# Patient Record
Sex: Female | Born: 1955 | Hispanic: Yes | Marital: Single | Smoking: Never smoker
Health system: Southern US, Community
[De-identification: ages and names within clinical notes are randomized; demographics above are authoritative.]

## PROBLEM LIST (undated history)

## (undated) DIAGNOSIS — I10 Essential (primary) hypertension: Secondary | ICD-10-CM

## (undated) DIAGNOSIS — R9439 Abnormal result of other cardiovascular function study: Secondary | ICD-10-CM

## (undated) DIAGNOSIS — E7800 Pure hypercholesterolemia, unspecified: Secondary | ICD-10-CM

## (undated) DIAGNOSIS — I671 Cerebral aneurysm, nonruptured: Secondary | ICD-10-CM

## (undated) HISTORY — PX: BRAIN SURGERY: SHX531

---

## 2015-12-15 ENCOUNTER — Emergency Department (HOSPITAL_BASED_OUTPATIENT_CLINIC_OR_DEPARTMENT_OTHER): Payer: BLUE CROSS/BLUE SHIELD

## 2015-12-15 ENCOUNTER — Encounter (HOSPITAL_BASED_OUTPATIENT_CLINIC_OR_DEPARTMENT_OTHER): Payer: Self-pay | Admitting: *Deleted

## 2015-12-15 ENCOUNTER — Observation Stay (HOSPITAL_BASED_OUTPATIENT_CLINIC_OR_DEPARTMENT_OTHER)
Admission: EM | Admit: 2015-12-15 | Discharge: 2015-12-18 | Disposition: A | Discharge status: Home / Self Care | Payer: BLUE CROSS/BLUE SHIELD | Attending: Internal Medicine | Admitting: Internal Medicine

## 2015-12-15 DIAGNOSIS — R079 Chest pain, unspecified: Secondary | ICD-10-CM | POA: Diagnosis present

## 2015-12-15 DIAGNOSIS — Z79899 Other long term (current) drug therapy: Secondary | ICD-10-CM | POA: Diagnosis not present

## 2015-12-15 DIAGNOSIS — R9439 Abnormal result of other cardiovascular function study: Secondary | ICD-10-CM | POA: Diagnosis not present

## 2015-12-15 DIAGNOSIS — I10 Essential (primary) hypertension: Secondary | ICD-10-CM | POA: Diagnosis not present

## 2015-12-15 DIAGNOSIS — I200 Unstable angina: Secondary | ICD-10-CM | POA: Insufficient documentation

## 2015-12-15 DIAGNOSIS — E785 Hyperlipidemia, unspecified: Secondary | ICD-10-CM | POA: Diagnosis not present

## 2015-12-15 DIAGNOSIS — Z7982 Long term (current) use of aspirin: Secondary | ICD-10-CM | POA: Diagnosis not present

## 2015-12-15 DIAGNOSIS — R519 Headache, unspecified: Secondary | ICD-10-CM

## 2015-12-15 DIAGNOSIS — Z8679 Personal history of other diseases of the circulatory system: Secondary | ICD-10-CM | POA: Diagnosis not present

## 2015-12-15 DIAGNOSIS — H547 Unspecified visual loss: Secondary | ICD-10-CM | POA: Diagnosis not present

## 2015-12-15 DIAGNOSIS — E7800 Pure hypercholesterolemia, unspecified: Secondary | ICD-10-CM | POA: Insufficient documentation

## 2015-12-15 DIAGNOSIS — R0789 Other chest pain: Principal | ICD-10-CM | POA: Insufficient documentation

## 2015-12-15 DIAGNOSIS — R51 Headache: Secondary | ICD-10-CM

## 2015-12-15 DIAGNOSIS — R55 Syncope and collapse: Secondary | ICD-10-CM

## 2015-12-15 HISTORY — DX: Abnormal result of other cardiovascular function study: R94.39

## 2015-12-15 HISTORY — DX: Cerebral aneurysm, nonruptured: I67.1

## 2015-12-15 HISTORY — DX: Essential (primary) hypertension: I10

## 2015-12-15 HISTORY — DX: Pure hypercholesterolemia, unspecified: E78.00

## 2015-12-15 LAB — D-DIMER, QUANTITATIVE: D-Dimer, Quant: 0.38 ug/mL-FEU (ref 0.00–0.50)

## 2015-12-15 LAB — CBC
HCT: 35.5 % — ABNORMAL LOW (ref 36.0–46.0)
Hemoglobin: 12 g/dL (ref 12.0–15.0)
MCH: 32.1 pg (ref 26.0–34.0)
MCHC: 33.8 g/dL (ref 30.0–36.0)
MCV: 94.9 fL (ref 78.0–100.0)
Platelets: 241 10*3/uL (ref 150–400)
RBC: 3.74 MIL/uL — ABNORMAL LOW (ref 3.87–5.11)
RDW: 12.4 % (ref 11.5–15.5)
WBC: 6.5 10*3/uL (ref 4.0–10.5)

## 2015-12-15 LAB — BASIC METABOLIC PANEL
Anion gap: 7 (ref 5–15)
BUN: 22 mg/dL — ABNORMAL HIGH (ref 6–20)
CO2: 27 mmol/L (ref 22–32)
Calcium: 9.2 mg/dL (ref 8.9–10.3)
Chloride: 105 mmol/L (ref 101–111)
Creatinine, Ser: 0.62 mg/dL (ref 0.44–1.00)
GFR calc Af Amer: >60 mL/min (ref >60)
GFR calc non Af Amer: >60 mL/min (ref >60)
Glucose, Bld: 99 mg/dL (ref 65–99)
Potassium: 3.5 mmol/L (ref 3.5–5.1)
Sodium: 139 mmol/L (ref 135–145)

## 2015-12-15 LAB — TROPONIN I
Troponin I: <0.03 ng/mL (ref <0.03)
Troponin I: <0.03 ng/mL (ref <0.03)

## 2015-12-15 MED ORDER — NITROGLYCERIN 0.4 MG SL SUBL
0.4000 mg | SUBLINGUAL_TABLET | SUBLINGUAL | Status: DC | PRN
  Administered 2015-12-15 (×2): 0.4 mg via SUBLINGUAL
  Filled 2015-12-15: qty 1

## 2015-12-15 MED ORDER — MORPHINE SULFATE (PF) 2 MG/ML IV SOLN
2.0000 mg | Freq: Once | INTRAVENOUS | Status: AC
  Administered 2015-12-16: 2 mg via INTRAVENOUS
  Filled 2015-12-15: qty 1

## 2015-12-15 MED ORDER — SODIUM CHLORIDE 0.9 % IV BOLUS (SEPSIS): 250.0000 mL | Freq: Once | INTRAVENOUS | Status: DC

## 2015-12-15 MED ORDER — ONDANSETRON HCL 4 MG/2ML IJ SOLN
4.0000 mg | Freq: Once | INTRAMUSCULAR | Status: AC
Start: 2015-12-15 — End: 2015-12-15
  Administered 2015-12-15: 4 mg via INTRAVENOUS
  Filled 2015-12-15: qty 2

## 2015-12-15 MED ORDER — SODIUM CHLORIDE 0.9 % IV SOLN: INTRAVENOUS | Status: DC

## 2015-12-15 MED ORDER — ASPIRIN 81 MG PO CHEW
162.0000 mg | CHEWABLE_TABLET | Freq: Once | ORAL | Status: AC
  Administered 2015-12-15: 162 mg via ORAL
  Filled 2015-12-15: qty 2

## 2015-12-15 NOTE — ED Notes (Signed)
Chest pain since yesterday. Sharp in nature. Sob today. Pain goes to her head per son in law.

## 2015-12-15 NOTE — ED Notes (Signed)
Patient crying in pain to her chest. Patient to x-ray at this time

## 2015-12-15 NOTE — ED Notes (Signed)
Patient states that she is having pain and it increases with a deep breath

## 2015-12-15 NOTE — ED Provider Notes (Signed)
CSN: [id number]     Arrival date & time 12/15/15  1836 History  By signing my name below, I, [facility] Washington, attest that this documentation has been prepared under the direction and in the presence of Vanetta MuldersScott Brody Kump, MD. Electronically Signed: Randell PatientMarrissa Washington, ED Scribe. 12/15/2015. 10:21 PM.   Chief Complaint  Patient presents with  . Chest Pain     Patient is a 60 y.o. female presenting with chest pain. The history is provided by the patient. No language interpreter was used.  Chest Pain Pain quality: pressure and sharp   Pain radiates to:  Does not radiate Pain radiates to the back: yes   Pain severity:  Severe Onset quality:  Gradual Duration:  1 day Timing:  Constant Progression:  Worsening Chronicity:  New Relieved by:  Nothing Worsened by:  Nothing tried Ineffective treatments:  Aspirin Associated symptoms: back pain, headache and shortness of breath   Associated symptoms: no abdominal pain, no cough, no fever, no nausea and not vomiting   Risk factors: hypertension   Risk factors: not female and no prior DVT/PE    HPI Comments: Monica Carr is a 60 y.o. female with a hx of HTN and high cholesterol who presents to the Emergency Department complaining of constant, gradually worsening, 10/10 central CP that radiates to her back onset yesterday, dramatically worse 8 hours ago. History is provided by a relative who acted as an interpreter for the pt. She notes that the pain began as sharp pain that progressed to pressure today. She reports associated HA, intermittent blurred vision, bilateral ankle swelling. Pain is worse with breathing. She has takes 2x baby aspirin daily and took this medication today without relief. He notes that [place] where she had an episode of complete vision loss. Denies similar symptoms in the past. Denies hx of MI. Denies fevers, nausea, vomiting, or any other symptoms currently.  Past Medical  History  Diagnosis Date  . Hypertension   . High cholesterol   . Cerebral aneurysm    Past Surgical History  Procedure Laterality Date  . Brain surgery     No family history on file. Social History  Substance Use Topics  . Smoking status: Never Smoker   . Smokeless tobacco: None  . Alcohol Use: No   OB History    No data available     Review of Systems  Constitutional: Negative for fever.  HENT: Negative for rhinorrhea and sore throat.   Eyes: Positive for visual disturbance.  Respiratory: Positive for shortness of breath. Negative for cough.   Cardiovascular: Positive for chest pain and leg swelling.  Gastrointestinal: Negative for nausea, vomiting and abdominal pain.  Genitourinary: Positive for frequency.  Musculoskeletal: Positive for back pain.  Skin: Negative for rash.  Neurological: Positive for headaches.  Hematological: Does not bruise/bleed easily.  Psychiatric/Behavioral: Negative for confusion.      Allergies  Review of patient's allergies indicates no known allergies.  Home Medications   Prior to Admission medications   Medication Sig Start Date End Date Taking? Authorizing Provider  Aspirin (ASPIR-81 PO) Take by mouth.   Yes Historical Provider, MD  lisinopril-hydrochlorothiazide (PRINZIDE,ZESTORETIC) 20-25 MG tablet Take 1 tablet by mouth daily.   Yes Historical Provider, MD  pravastatin (PRAVACHOL) 40 MG tablet Take 40 mg by mouth daily.   Yes Historical Provider, MD   BP 154/77 mmHg  Pulse 68  Temp(Src) 98.6 F (37 C) (Oral)  Resp 14  Ht 5\' 1"  (1.549 m)  Wt 72.576 kg  BMI 30.25 kg/m2  SpO2 96% Physical Exam  Constitutional: She is oriented to person, place, and time. She appears well-developed and well-nourished. No distress.  HENT:  Head: Normocephalic and atraumatic.  Mouth/Throat: Mucous membranes are normal.  Mpist mucus membranes  Eyes: Conjunctivae and EOM are normal. Pupils are equal, round, and reactive to light.  Injected  scleras. Pupils and EOM normal.  Neck: Normal range of motion.  Cardiovascular: Normal rate and regular rhythm.   Regular rate and rhythm.  Pulmonary/Chest: Effort normal and breath sounds normal. No respiratory distress. She exhibits tenderness.  Abdominal: Bowel sounds are normal. There is tenderness.  Epigastric tenderness.  Musculoskeletal: Normal range of motion. She exhibits no edema.  No swelling in bilateral ankles.  Neurological: She is alert and oriented to person, place, and time.  Skin: Skin is warm and dry.  Psychiatric: She has a normal mood and affect. Her behavior is normal.  Nursing note and vitals reviewed.   ED Course  Procedures   DIAGNOSTIC STUDIES: Oxygen Saturation is 100% on RA, normal by my interpretation.    COORDINATION OF CARE: 10:08 PM Ordered chest x-ray and labs. Discussed treatment plan with pt at bedside and pt agreed to plan.   Labs Review Labs Reviewed  BASIC METABOLIC PANEL - Abnormal; Notable for the following:    BUN 22 (*)    All other components within normal limits  CBC - Abnormal; Notable for the following:    RBC 3.74 (*)    HCT 35.5 (*)    All other components within normal limits  TROPONIN I  D-DIMER, QUANTITATIVE (NOT AT [facility])  TROPONIN I   Results for orders placed or performed during the hospital encounter of 12/15/15  Basic metabolic panel  Result Value Ref Range   Sodium 139 135 - 145 mmol/L   Potassium 3.5 3.5 - 5.1 mmol/L   Chloride 105 101 - 111 mmol/L   CO2 27 22 - 32 mmol/L   Glucose, Bld 99 65 - 99 mg/dL   BUN 22 (H) 6 - 20 mg/dL   Creatinine, Ser 8.11 0.44 - 1.00 mg/dL   Calcium 9.2 8.9 - 91.4 mg/dL   GFR calc non Af Amer >60 >60 mL/min   GFR calc Af Amer >60 >60 mL/min   Anion gap 7 5 - 15  CBC  Result Value Ref Range   WBC 6.5 4.0 - 10.5 K/uL   RBC 3.74 (L) 3.87 - 5.11 MIL/uL   Hemoglobin 12.0 12.0 - 15.0 g/dL   HCT 78.2 (L) 95.6 - 21.3 %   MCV 94.9 78.0 - 100.0 fL   MCH 32.1 26.0 - 34.0 pg   MCHC  33.8 30.0 - 36.0 g/dL   RDW 08.6 57.8 - 46.9 %   Platelets 241 150 - 400 K/uL  Troponin I  Result Value Ref Range   Troponin I <0.03 <0.03 ng/mL  D-dimer, quantitative (not at [facility])  Result Value Ref Range   D-Dimer, Quant 0.38 0.00 - 0.50 ug/mL-FEU  Troponin I  Result Value Ref Range   Troponin I <0.03 <0.03 ng/mL     Imaging Review Dg Chest 2 View  12/15/2015  CLINICAL DATA:  Acute chest pain and shortness of breath for 1 day. EXAM: CHEST  2 VIEW COMPARISON:  None. FINDINGS: The cardiopericardial silhouette is unremarkable. A mildly tortuous/ectatic thoracic aorta noted. There is no evidence of focal airspace disease, pulmonary edema, suspicious pulmonary nodule/mass, pleural  effusion, or pneumothorax. No acute bony abnormalities are identified. IMPRESSION: No evidence of acute cardiopulmonary disease. Electronically Signed   By: Harmon Pier M.D.   On: 12/15/2015 20:02   I have personally reviewed and evaluated these images and lab results as part of my medical decision-making.   EKG Interpretation   Date/Time:  Friday December 15 2015 18:50:30 EDT Ventricular Rate:  71 PR Interval:    QRS Duration: 90 QT Interval:  390 QTC Calculation: 423 R Axis:   60 Text Interpretation:  Accelerated Junctional rhythm Possible Anterior  infarct , age undetermined ST \\T \ T wave abnormality, consider lateral  ischemia Abnormal ECG No previous ECGs available Confirmed by Cray Monnin   MD, Glema Takaki [phone]) on 12/15/2015 8:19:09 PM      MDM   Final diagnoses:  Chest pain, unspecified chest pain type   Patient with new onset of chest pain yesterday no prior history of chest pain.  Patient's had prior history of stroke or head bleed. But no cardiac history. Patient has cardiac risk factors of hypertension and high cholesterol.  Troponins 2 are negative however chest pain got worse today at about 2 in the afternoon and has persisted. This warrants admission for chest pain rule out.  D-dimer  negative no evidence of pulmonary embolism based on that. Patient is not tachycardic does complain of shortness of breath chest x-rays negative for pneumonia pneumothorax or pulmonary edema. EKG on shows a junctional rhythm with a possible anterior infarct age undetermined. But nothing acute.  Patient's chest pain improved significantly with with completing her aspirin course. And sublingual nitroglycerin. Pain went from 10 out of 10 down to 5 out of 10. A sheet given morphine with further resolution of the chest pain. Patient much more comfortable. Pain not completely gone.    I personally performed the services described in this documentation, which was scribed in my presence. The recorded information has been reviewed and is accurate.     Vanetta Mulders, MD 12/15/15 [phone]

## 2015-12-15 NOTE — ED Notes (Signed)
Patient states that her pain has decreased from 10 /10 to a 7/10 - patient reports that she now has a HA

## 2015-12-16 ENCOUNTER — Observation Stay (HOSPITAL_BASED_OUTPATIENT_CLINIC_OR_DEPARTMENT_OTHER): Payer: BLUE CROSS/BLUE SHIELD

## 2015-12-16 ENCOUNTER — Observation Stay (HOSPITAL_COMMUNITY): Payer: BLUE CROSS/BLUE SHIELD

## 2015-12-16 DIAGNOSIS — R079 Chest pain, unspecified: Secondary | ICD-10-CM | POA: Diagnosis present

## 2015-12-16 DIAGNOSIS — I209 Angina pectoris, unspecified: Secondary | ICD-10-CM | POA: Diagnosis not present

## 2015-12-16 DIAGNOSIS — E785 Hyperlipidemia, unspecified: Secondary | ICD-10-CM | POA: Diagnosis not present

## 2015-12-16 DIAGNOSIS — R9439 Abnormal result of other cardiovascular function study: Secondary | ICD-10-CM | POA: Diagnosis not present

## 2015-12-16 LAB — NM MYOCAR MULTI W/SPECT W/WALL MOTION / EF
CHL CUP RESTING HR STRESS: 56 {beats}/min
CSEPEDS: 28 s
CSEPEW: 1 METS
Exercise duration (min): 5 min
Peak HR: 62 {beats}/min

## 2015-12-16 LAB — BASIC METABOLIC PANEL
Anion gap: 7 (ref 5–15)
BUN: 15 mg/dL (ref 6–20)
CALCIUM: 9.2 mg/dL (ref 8.9–10.3)
CO2: 29 mmol/L (ref 22–32)
CREATININE: 0.62 mg/dL (ref 0.44–1.00)
Chloride: 105 mmol/L (ref 101–111)
Glucose, Bld: 118 mg/dL — ABNORMAL HIGH (ref 65–99)
Potassium: 3.6 mmol/L (ref 3.5–5.1)
Sodium: 141 mmol/L (ref 135–145)

## 2015-12-16 LAB — LIPID PANEL
CHOL/HDL RATIO: 4.9 ratio
Cholesterol: 194 mg/dL (ref 0–200)
HDL: 40 mg/dL — ABNORMAL LOW (ref >40)
LDL Cholesterol: 116 mg/dL — ABNORMAL HIGH (ref 0–99)
Triglycerides: 192 mg/dL — ABNORMAL HIGH (ref <150)
VLDL: 38 mg/dL (ref 0–40)

## 2015-12-16 LAB — CBC
HCT: 36 % (ref 36.0–46.0)
Hemoglobin: 12 g/dL (ref 12.0–15.0)
MCH: 31.6 pg (ref 26.0–34.0)
MCHC: 33.3 g/dL (ref 30.0–36.0)
MCV: 94.7 fL (ref 78.0–100.0)
PLATELETS: 233 10*3/uL (ref 150–400)
RBC: 3.8 MIL/uL — ABNORMAL LOW (ref 3.87–5.11)
RDW: 12.5 % (ref 11.5–15.5)
WBC: 6.1 10*3/uL (ref 4.0–10.5)

## 2015-12-16 LAB — TROPONIN I
Troponin I: <0.03 ng/mL (ref <0.03)
Troponin I: <0.03 ng/mL (ref <0.03)

## 2015-12-16 LAB — TSH: TSH: 2.367 u[IU]/mL (ref 0.350–4.500)

## 2015-12-16 MED ORDER — LISINOPRIL 20 MG PO TABS
20.0000 mg | ORAL_TABLET | Freq: Every day | ORAL | Status: DC
  Administered 2015-12-16 – 2015-12-17 (×2): 20 mg via ORAL
  Filled 2015-12-16 (×2): qty 1

## 2015-12-16 MED ORDER — ALUM & MAG HYDROXIDE-SIMETH 200-200-20 MG/5ML PO SUSP
15.0000 mL | Freq: Four times a day (QID) | ORAL | Status: DC | PRN
  Administered 2015-12-16 – 2015-12-17 (×2): 15 mL via ORAL
  Filled 2015-12-16 (×2): qty 30

## 2015-12-16 MED ORDER — REGADENOSON 0.4 MG/5ML IV SOLN
INTRAVENOUS | Status: AC
Start: 2015-12-16 — End: 2015-12-16
  Filled 2015-12-16: qty 5

## 2015-12-16 MED ORDER — METOPROLOL TARTRATE 5 MG/5ML IV SOLN: 5.0000 mg | INTRAVENOUS | Status: DC | PRN

## 2015-12-16 MED ORDER — ENOXAPARIN SODIUM 40 MG/0.4ML ~~LOC~~ SOLN
40.0000 mg | SUBCUTANEOUS | Status: DC
  Administered 2015-12-16 – 2015-12-17 (×2): 40 mg via SUBCUTANEOUS
  Filled 2015-12-16 (×2): qty 0.4

## 2015-12-16 MED ORDER — MORPHINE SULFATE (PF) 2 MG/ML IV SOLN
1.0000 mg | Freq: Two times a day (BID) | INTRAVENOUS | Status: DC | PRN
  Administered 2015-12-18: 1 mg via INTRAVENOUS
  Filled 2015-12-16: qty 1

## 2015-12-16 MED ORDER — IBUPROFEN 600 MG PO TABS
600.0000 mg | ORAL_TABLET | ORAL | Status: AC | PRN
  Administered 2015-12-16 (×2): 600 mg via ORAL
  Filled 2015-12-16 (×2): qty 1

## 2015-12-16 MED ORDER — LISINOPRIL-HYDROCHLOROTHIAZIDE 20-25 MG PO TABS: 1.0000 | ORAL_TABLET | Freq: Every day | ORAL | Status: DC

## 2015-12-16 MED ORDER — NITROGLYCERIN 2 % TD OINT
0.5000 [in_us] | TOPICAL_OINTMENT | Freq: Three times a day (TID) | TRANSDERMAL | Status: DC
  Administered 2015-12-16: 0.5 [in_us] via TOPICAL
  Filled 2015-12-16: qty 30

## 2015-12-16 MED ORDER — KETOROLAC TROMETHAMINE 30 MG/ML IJ SOLN
30.0000 mg | Freq: Once | INTRAMUSCULAR | Status: AC
  Administered 2015-12-16: 30 mg via INTRAVENOUS
  Filled 2015-12-16: qty 1

## 2015-12-16 MED ORDER — HYDROCHLOROTHIAZIDE 25 MG PO TABS
25.0000 mg | ORAL_TABLET | Freq: Every day | ORAL | Status: DC
  Administered 2015-12-16 – 2015-12-17 (×2): 25 mg via ORAL
  Filled 2015-12-16 (×2): qty 1

## 2015-12-16 MED ORDER — TECHNETIUM TC 99M TETROFOSMIN IV KIT
30.0000 | PACK | Freq: Once | INTRAVENOUS | Status: AC | PRN
  Administered 2015-12-16: 30 via INTRAVENOUS

## 2015-12-16 MED ORDER — TECHNETIUM TC 99M TETROFOSMIN IV KIT
10.0000 | PACK | Freq: Once | INTRAVENOUS | Status: AC | PRN
  Administered 2015-12-16: 10 via INTRAVENOUS

## 2015-12-16 MED ORDER — PRAVASTATIN SODIUM 40 MG PO TABS
40.0000 mg | ORAL_TABLET | Freq: Every day | ORAL | Status: DC
  Administered 2015-12-16 – 2015-12-18 (×3): 40 mg via ORAL
  Filled 2015-12-16 (×3): qty 1

## 2015-12-16 MED ORDER — SODIUM CHLORIDE 0.9% FLUSH
3.0000 mL | Freq: Two times a day (BID) | INTRAVENOUS | Status: DC
  Administered 2015-12-16 – 2015-12-18 (×3): 3 mL via INTRAVENOUS

## 2015-12-16 MED ORDER — REGADENOSON 0.4 MG/5ML IV SOLN
0.4000 mg | Freq: Once | INTRAVENOUS | Status: AC
  Administered 2015-12-16: 0.4 mg via INTRAVENOUS

## 2015-12-16 NOTE — Progress Notes (Addendum)
Pt seen and examined, admitted earlier this am 60/F with HTN, dyslipidemia with atypical chest pain EKG unremarkable, D dimer negative, troponins negative Chest pain free now, d/w Dr.Tilley, plan for Myoview per Cards today  Zannie CovePreetha Callan Yontz, MD

## 2015-12-16 NOTE — Progress Notes (Signed)
Patient presented for Lexiscan. Tolerated procedure well. Result to follow. Amy, RN at [facility] used as interpreter.

## 2015-12-16 NOTE — H&P (Addendum)
PCP:   No PCP Per Patient   Chief Complaint:  Chest pain  HPI: This is a 60 year old female who's had chest pain since 2 days. Chest pain is left-sided. She denies radiation down the arm with since he related to her face. She denies any shortness of breath, cough, wheeze. She describes this chest pain as sharp and continuous. At its worse it was 10/10, currently 7/10. She has no history of coronary artery disease, she doesn't frequently of chest pains. She recently traveled from [facility]. At that point showed a headache and she states she had a brief episode of vision loss. She has had no recurrence. In the ER the patient had some elevated blood pressures, with systolic blood pressure to 194. She has no other neurological symptoms.  Review of Systems:  The patient denies anorexia, fever, weight loss, vision loss, decreased hearing, hoarseness, chest pain, syncope, dyspnea on exertion, peripheral edema, balance deficits, hemoptysis, abdominal pain, melena, hematochezia, severe indigestion/heartburn, hematuria, incontinence, genital sores, muscle weakness, suspicious skin lesions, transient blindness, difficulty walking, depression, unusual weight change, abnormal bleeding, enlarged lymph nodes, angioedema, and breast masses.  Past Medical History: Past Medical History  Diagnosis Date  . Hypertension   . High cholesterol   . Cerebral aneurysm    Past Surgical History  Procedure Laterality Date  . Brain surgery      Medications: Prior to Admission medications   Medication Sig Start Date End Date Taking? Authorizing Provider  Aspirin (ASPIR-81 PO) Take by mouth.   Yes Historical Provider, MD  lisinopril-hydrochlorothiazide (PRINZIDE,ZESTORETIC) 20-25 MG tablet Take 1 tablet by mouth daily.   Yes Historical Provider, MD  pravastatin (PRAVACHOL) 40 MG tablet Take 40 mg by mouth daily.   Yes Historical Provider, MD    Allergies:  No Known Allergies  Social History:  reports that she has  never smoked. She does not have any smokeless tobacco history on file. She reports that she does not drink alcohol or use illicit drugs.  Family History: HTN  Physical Exam: Filed Vitals:   12/16/15 0100 12/16/15 0130 12/16/15 0200 12/16/15 0334  BP: 141/65 126/63 146/60 154/66  Pulse: 69 67 65 72  Temp:    98.4 F (36.9 C)  TempSrc:    Oral  Resp: Height:     (1.549 m)  Weight:    81.058 kg (178 lb 11.2 oz)  SpO2: 95% 97% 92% 92%    General:  Alert and oriented times three, well developed and nourished, no acute distress Eyes: PERRLA, pink conjunctiva, no scleral icterus ENT: Moist oral mucosa, neck supple, no thyromegaly Lungs: clear to ascultation, no wheeze, no crackles, no use of accessory muscles, very reproducible chest wall pain Cardiovascular: regular rate and rhythm, no regurgitation, no gallops, no murmurs. No carotid bruits, no JVD Abdomen: soft, positive BS, non-tender, non-distended, no organomegaly, not an acute abdomen GU: not examined Neuro: CN II - XII grossly intact, sensation intact Musculoskeletal: strength 5/5 all extremities, no clubbing, cyanosis or edema Skin: no rash, no subcutaneous crepitation, no decubitus Psych: appropriate patient   Labs on Admission:   Recent Labs  12/15/15 1930  NA 139  K 3.5  CL 105  CO2 27  GLUCOSE 99  BUN 22*  CREATININE 0.62  CALCIUM 9.2   No results for input(s): AST, ALT, ALKPHOS, BILITOT, PROT, ALBUMIN in the last 72 hours. No results for input(s): LIPASE, AMYLASE in the last 72 hours.  Recent Labs  12/15/15  1930  WBC 6.5  HGB 12.0  HCT 35.5*  MCV 94.9  PLT 241    Recent Labs  12/15/15 1930 12/15/15 2240  TROPONINI <0.03 <0.03   Invalid input(s): POCBNP  Recent Labs  12/15/15 2240  DDIMER 0.38   No results for input(s): HGBA1C in the last 72 hours. No results for input(s): CHOL, HDL, LDLCALC, TRIG, CHOLHDL, LDLDIRECT in the last 72 hours. No results for input(s): TSH,  T4TOTAL, T3FREE, THYROIDAB in the last 72 hours.  Invalid input(s): FREET3 No results for input(s): VITAMINB12, FOLATE, FERRITIN, TIBC, IRON, RETICCTPCT in the last 72 hours.  Micro Results: No results found for this or any previous visit (from the past 240 hour(s)).   Radiological Exams on Admission: Dg Chest 2 View  12/15/2015  CLINICAL DATA:  Acute chest pain and shortness of breath for 1 day. EXAM: CHEST  2 VIEW COMPARISON:  None. FINDINGS: The cardiopericardial silhouette is unremarkable. A mildly tortuous/ectatic thoracic aorta noted. There is no evidence of focal airspace disease, pulmonary edema, suspicious pulmonary nodule/mass, pleural effusion, or pneumothorax. No acute bony abnormalities are identified. IMPRESSION: No evidence of acute cardiopulmonary disease. Electronically Signed   By: Harmon PierJeffrey  Hu M.D.   On: 12/15/2015 20:02    Assessment/Plan Present on Admission:  . Chest pain -Bring in for 23 hour observation to telemetry -Cycle cardiac enzymes, lipid panel in a.m. -Aspirin daily. Nitroglycerin ointment ordered -Repeat d-dimer once -Morphine, oxygen  -Stress test the inpatient versus outpatient -Toradol 30 mg IV once for muscular skeletal component  Vision loss - brief -CT head ordered stat. Included in the differential diagnosis are TIA vs MS -Baby aspirin will be started once CT head is negative  Hypertension - uncontrolled -Resume home medications,. Blood pressure medications ordered. -Blood pressure already improved with systolic blood pressure 146  Dyslipidemia - Stable, resume home medication   Priyanka Causey 12/16/2015, 4:39 AM

## 2015-12-16 NOTE — Progress Notes (Signed)
  Myoview showed   1. Small area of mild reversibility involves the distal septum and distal aspect of the inferior wall.  2. Normal left ventricular wall motion.  3. Left ventricular ejection fraction 71%  4. Non invasive risk stratification*: Low

## 2015-12-16 NOTE — Plan of Care (Signed)
60 yo F hx of HL, HTN, Cerebral Anuerism Chest pain since yesterday worse over past few days.  chest pain is pleuritic but d.dimer was negative No CT was done She was given nitro and morphine recent trip from [place].  Trop neg x2 There is some reproduction with palpation pain is constant. Accepted to 3 west stepdown /tele if pain resolves  [name] 12:07 AM

## 2015-12-17 ENCOUNTER — Encounter (HOSPITAL_COMMUNITY): Payer: Self-pay | Admitting: Cardiology

## 2015-12-17 DIAGNOSIS — E785 Hyperlipidemia, unspecified: Secondary | ICD-10-CM | POA: Diagnosis not present

## 2015-12-17 DIAGNOSIS — R079 Chest pain, unspecified: Secondary | ICD-10-CM | POA: Diagnosis not present

## 2015-12-17 DIAGNOSIS — I10 Essential (primary) hypertension: Secondary | ICD-10-CM | POA: Diagnosis not present

## 2015-12-17 DIAGNOSIS — R9439 Abnormal result of other cardiovascular function study: Secondary | ICD-10-CM | POA: Diagnosis not present

## 2015-12-17 DIAGNOSIS — R9431 Abnormal electrocardiogram [ECG] [EKG]: Secondary | ICD-10-CM

## 2015-12-17 DIAGNOSIS — I209 Angina pectoris, unspecified: Secondary | ICD-10-CM | POA: Diagnosis not present

## 2015-12-17 HISTORY — DX: Essential (primary) hypertension: I10

## 2015-12-17 HISTORY — DX: Abnormal result of other cardiovascular function study: R94.39

## 2015-12-17 MED ORDER — IBUPROFEN 600 MG PO TABS
600.0000 mg | ORAL_TABLET | ORAL | Status: DC | PRN
  Administered 2015-12-17: 600 mg via ORAL
  Filled 2015-12-17: qty 1

## 2015-12-17 NOTE — Progress Notes (Signed)
PROGRESS NOTE    Monica Carr  WUJ:[id number] DOB: 09/16/1955 DOA: 12/15/2015 PCP: No PCP Per Patient  Brief Narrative:This is a 60 year old female who's had chest pain since 2 days. Chest pain is left-sided. She denies radiation down the arm with since he related to her face. She denies any shortness of breath, cough, wheeze. She describes this chest pain as sharp and continuous. At its worse it was 10/10, currently 7/10. She has no history of coronary artery disease, she doesn't frequently of chest pains.  Assessment & Plan:  1. Atypical chest pain -with mildly abnormal myoview -troponins negative, d dimer normal -Cards consulted, [facility]  2. Hypertension -continue lisinopril/HCTZ, hold for cath tomorrow  3. Dyslipidemia -continue statin  Full Code COmmunication: family member at bedside DIspo: Home tomorrow if cath negative   Consultants:   Cards  Procedures: Myoview:IMPRESSION: 1. Small area of mild reversibility involves the distal septum and distal aspect of the inferior wall.  2. Normal left ventricular wall motion.  3. Left ventricular ejection fraction 71%  4. Non invasive risk stratification*: Low   Subjective: Still having mild substernal chest pressure  Objective: Filed Vitals:   12/16/15 1059 12/16/15 1643 12/16/15 1944 12/17/15 0455  BP: 146/72 127/47 139/70 119/57  Pulse: 56 66 64 60  Temp:  98.6 F (37 C) 98.7 F (37.1 C) 98.3 F (36.8 C)  TempSrc:  Oral Oral Oral  Resp:   18 11  Height:      Weight:    81.375 kg (179 lb 6.4 oz)  SpO2:  96% 95% 99%    Intake/Output Summary (Last 24 hours) at 12/17/15 1210 Last data filed at 12/17/15 0856  Gross per 24 hour  Intake    720 ml  Output      0 ml  Net    720 ml   Filed Weights   12/15/15 1854 12/16/15 0334 12/17/15 0455  Weight: 72.576 kg (160 lb) 81.058 kg (178 lb 11.2 oz) 81.375 kg (179 lb 6.4 oz)    Examination:  General exam: Appears calm and comfortable    Respiratory system: Clear to auscultation. Respiratory effort normal. Cardiovascular system: S1 & S2 heard, RRR. No JVD, murmurs, rubs, gallops or clicks. No pedal edema. Gastrointestinal system: Abdomen is nondistended, soft and nontender. No organomegaly or masses felt. Normal bowel sounds heard. Central nervous system: Alert and oriented. No focal neurological deficits. Extremities: Symmetric 5 x 5 power. Skin: No rashes, lesions or ulcers Psychiatry: Judgement and insight appear normal. Mood & affect appropriate.     Data Reviewed: I have personally reviewed following labs and imaging studies  CBC:  Recent Labs Lab 12/15/15 1930 12/16/15 0458  WBC 6.5 6.1  HGB 12.0 12.0  HCT 35.5* 36.0  MCV 94.9 94.7  PLT 241 233   Basic Metabolic Panel:  Recent Labs Lab 12/15/15 1930 12/16/15 0458  NA 139 141  K 3.5 3.6  CL 105 105  CO2 27 29  GLUCOSE 99 118*  BUN 22* 15  CREATININE 0.62 0.62  CALCIUM 9.2 9.2   GFR: Estimated Creatinine Clearance: 71.3 mL/min (by C-G formula based on Cr of 0.62). Liver Function Tests: No results for input(s): AST, ALT, ALKPHOS, BILITOT, PROT, ALBUMIN in the last 168 hours. No results for input(s): LIPASE, AMYLASE in the last 168 hours. No results for input(s): AMMONIA in the last 168 hours. Coagulation Profile: No results for input(s): INR, PROTIME in the last 168 hours. Cardiac Enzymes:  Recent Labs Lab 12/15/15  1930 12/15/15 2240 12/16/15 0458 12/16/15 1405 12/16/15 1926  TROPONINI <0.03 <0.03 <0.03 <0.03 <0.03   BNP (last 3 results) No results for input(s): PROBNP in the last 8760 hours. HbA1C: No results for input(s): HGBA1C in the last 72 hours. CBG: No results for input(s): GLUCAP in the last 168 hours. Lipid Profile:  Recent Labs  12/16/15 0450  CHOL 194  HDL 40*  LDLCALC 116*  TRIG 192*  CHOLHDL 4.9   Thyroid Function Tests:  Recent Labs  12/16/15 0450  TSH 2.367   Anemia Panel: No results for input(s):  VITAMINB12, FOLATE, FERRITIN, TIBC, IRON, RETICCTPCT in the last 72 hours. Urine analysis: No results found for: COLORURINE, APPEARANCEUR, LABSPEC, PHURINE, GLUCOSEU, HGBUR, BILIRUBINUR, KETONESUR, PROTEINUR, UROBILINOGEN, NITRITE, LEUKOCYTESUR Sepsis Labs: (procalcitonin:4,lacticidven:4)  )No results found for this or any previous visit (from the past 240 hour(s)).       Radiology Studies: Dg Chest 2 View  12/15/2015  CLINICAL DATA:  Acute chest pain and shortness of breath for 1 day. EXAM: CHEST  2 VIEW COMPARISON:  None. FINDINGS: The cardiopericardial silhouette is unremarkable. A mildly tortuous/ectatic thoracic aorta noted. There is no evidence of focal airspace disease, pulmonary edema, suspicious pulmonary nodule/mass, pleural effusion, or pneumothorax. No acute bony abnormalities are identified. IMPRESSION: No evidence of acute cardiopulmonary disease. Electronically Signed   By: Harmon Pier M.D.   On: 12/15/2015 20:02   Ct Head Wo Contrast  12/16/2015  CLINICAL DATA:  60 year old female with a history of chest pain. Headache. EXAM: CT HEAD WITHOUT CONTRAST TECHNIQUE: Contiguous axial images were obtained from the base of the skull through the vertex without intravenous contrast. COMPARISON:  None. FINDINGS: Unremarkable appearance of the calvarium without acute fracture or aggressive lesion. Unremarkable appearance of the scalp soft tissues. Bilateral proptosis. Mastoid air cells are clear. Low-density lobulated soft tissue of the right maxillary sinus. No acute intracranial hemorrhage. No midline shift. No mass effect. Adjacent small cysts in the left caudate and anterior putamen. These measure approximately 12 mm and 11 mm. No mass effect. IMPRESSION: No CT evidence of acute intracranial abnormality. 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 Bilateral  proptosis. Correlation with thyroid lab values may be useful. Signed, Yvone Neu. Loreta Ave, DO [facility] Radiology Electronically Signed   By: Gilmer Mor D.O.   On: 12/16/2015 08:13   Nm Myocar Multi W/spect W/wall Motion / Ef  12/16/2015  CLINICAL DATA:  Chest pain.  Hypertension and elevated cholesterol. EXAM: MYOCARDIAL IMAGING WITH SPECT (REST AND PHARMACOLOGIC-STRESS) GATED LEFT VENTRICULAR WALL MOTION STUDY LEFT VENTRICULAR EJECTION FRACTION TECHNIQUE: Standard myocardial SPECT imaging was performed after resting intravenous injection of 10 mCi Tc-42m tetrofosmin. Subsequently, intravenous infusion of Lexiscan was performed under the supervision of the Cardiology staff. At peak effect of the drug, 30 mCi Tc-45m tetrofosmin was injected intravenously and standard myocardial SPECT imaging was performed. Quantitative gated imaging was also performed to evaluate left ventricular wall motion, and estimate left ventricular ejection fraction. COMPARISON:  None. FINDINGS: Perfusion: No decreased activity in the left ventricle on stress imaging to suggest reversible ischemia or infarction. Wall Motion: Small area of mild reversibility involves the distal septum and distal inferior wall Left Ventricular Ejection Fraction: 71 % End diastolic volume 80 ml End systolic volume 24 ml IMPRESSION: 1. Small area of mild reversibility involves the distal septum and distal aspect of the  inferior wall. 2. Normal left ventricular wall motion. 3. Left ventricular ejection fraction 71% 4. Non invasive risk stratification*: Low *2012 Appropriate Use Criteria for Coronary Revascularization Focused Update: J Am Coll Cardiol. 2012;59(9):857-881. http://content.dementiazones.com.aspx?articleid=[email or web address] Electronically Signed   By: Signa Kell M.D.   On: 12/16/2015 15:32        Scheduled Meds: . enoxaparin (LOVENOX) injection  40 mg Subcutaneous Q24H  . lisinopril  20 mg  Oral Daily   And  . hydrochlorothiazide  25 mg Oral Daily  . pravastatin  40 mg Oral q1800  . sodium chloride  250 mL Intravenous Once  . sodium chloride flush  3 mL Intravenous Q12H   Continuous Infusions:       Time spent:    Zannie Cove, MD [facility] Pager [phone]  If 7PM-7AM, please contact night-coverage [email or web address] Password [email or web address] 12/17/2015, 12:10 PM

## 2015-12-17 NOTE — Consult Note (Signed)
Reason for Consult: chest pain and abnormal stress test.    Referring Physician: Dr. Broadus John  PCP:  No PCP Per Patient  Primary Cardiologist: new  Dr. [name] is an 60 y.o. female.    Chief Complaint: admitted 12/16/15 AM with chest pain.    HPI: Asked to see 32 yof - she speaks spanish but her English is understandable and she understands.  She has a hx of HTN, elevated cholesterol and cerebral aneurysm with surgery.  She was admitted 12/16/15 after presenting to ER with 2 day hx of chest pain on the Lt side.  It was described as sharp pain per note but she tells me it was a heavy pressure.  It did radiate up back of neck into head.  No SOB, diaphoresis or nausea.  She recently traveled from [facility]. At that point showed a headache and she states she had a brief episode of vision loss.  CT of her head without acute abnormality.     EKG with SR and possible lat ischemia.  Troponin have been negative <0.03 X 4.  TSH and D-Dimer was normal.    Lexiscan myoview was done: IMPRESSION: 1. Small area of mild reversibility involves the distal septum and distal aspect of the inferior wall.  2. Normal left ventricular wall motion.  3. Left ventricular ejection fraction 71%  4. Non invasive risk stratification*: Low  Today pt has some chest pressure does increase with deep breath.  After meals she also has some discomfort.    Past Medical History  Diagnosis Date  . Hypertension   . High cholesterol   . Cerebral aneurysm   . HTN (hypertension) 12/17/2015  . Abnormal cardiovascular stress test 12/17/2015    Past Surgical History  Procedure Laterality Date  . Brain surgery      Family History  Problem Relation Age of Onset  . Hypertension Mother   No one in her family has CAD that she is aware.  Social History:  reports that she has never smoked. She does not have any smokeless tobacco history on file. She reports that she does not drink alcohol or  use illicit drugs.  Allergies: No Known Allergies   OUTPATIENT MEDICATIONS: Prior to Admission medications   Medication Sig Start Date End Date Taking? Authorizing Provider  Aspirin (ASPIR-81 PO) Take by mouth.   Yes Historical Provider, MD  lisinopril-hydrochlorothiazide (PRINZIDE,ZESTORETIC) 20-25 MG tablet Take 1 tablet by mouth daily.   Yes Historical Provider, MD  pravastatin (PRAVACHOL) 40 MG tablet Take 40 mg by mouth daily.   Yes Historical Provider, MD       CURRENT MEDICATIONS: Scheduled Meds: . enoxaparin (LOVENOX) injection  40 mg Subcutaneous Q24H  . lisinopril  20 mg Oral Daily   And  . hydrochlorothiazide  25 mg Oral Daily  . pravastatin  40 mg Oral q1800  . sodium chloride  250 mL Intravenous Once  . sodium chloride flush  3 mL Intravenous Q12H   Continuous Infusions:  PRN Meds:.alum & mag hydroxide-simeth, morphine injection, nitroGLYCERIN   Results for orders placed or performed during the hospital encounter of 12/15/15 (from the past 48 hour(s))  Basic metabolic panel     Status: Abnormal   Collection Time: 12/15/15  7:30 PM  Result Value Ref Range   Sodium 139 135 - 145 mmol/L   Potassium 3.5 3.5 - 5.1 mmol/L   Chloride 105 101 - 111 mmol/L   CO2  27 22 - 32 mmol/L   Glucose, Bld 99 65 - 99 mg/dL   BUN 22 (H) 6 - 20 mg/dL   Creatinine, Ser 0.62 0.44 - 1.00 mg/dL   Calcium 9.2 8.9 - 10.3 mg/dL   GFR calc non Af Amer >60 >60 mL/min   GFR calc Af Amer >60 >60 mL/min    Comment: (NOTE) The eGFR has been calculated using the CKD EPI equation. This calculation has not been validated in all clinical situations. eGFR's persistently <60 mL/min signify possible Chronic Kidney Disease.    Anion gap 7 5 - 15  CBC     Status: Abnormal   Collection Time: 12/15/15  7:30 PM  Result Value Ref Range   WBC 6.5 4.0 - 10.5 K/uL   RBC 3.74 (L) 3.87 - 5.11 MIL/uL   Hemoglobin 12.0 12.0 - 15.0 g/dL   HCT 35.5 (L) 36.0 - 46.0 %   MCV 94.9  78.0 - 100.0 fL   MCH 32.1 26.0 - 34.0 pg   MCHC 33.8 30.0 - 36.0 g/dL   RDW 12.4 11.5 - 15.5 %   Platelets 241 150 - 400 K/uL  Troponin I     Status: None   Collection Time: 12/15/15  7:30 PM  Result Value Ref Range   Troponin I <0.03 <0.03 ng/mL  D-dimer, quantitative (not at [facility])     Status: None   Collection Time: 12/15/15 10:40 PM  Result Value Ref Range   D-Dimer, Quant 0.38 0.00 - 0.50 ug/mL-FEU    Comment: (NOTE) At the manufacturer cut-off of 0.50 ug/mL FEU, this assay has been documented to exclude PE with a sensitivity and negative predictive value of 97 to 99%.  At this time, this assay has not been approved by the FDA to exclude DVT/VTE. Results should be correlated with clinical presentation.   Troponin I     Status: None   Collection Time: 12/15/15 10:40 PM  Result Value Ref Range   Troponin I <0.03 <0.03 ng/mL  TSH     Status: None   Collection Time: 12/16/15  4:50 AM  Result Value Ref Range   TSH 2.367 0.350 - 4.500 uIU/mL  Lipid panel     Status: Abnormal   Collection Time: 12/16/15  4:50 AM  Result Value Ref Range   Cholesterol 194 0 - 200 mg/dL   Triglycerides 192 (H) <150 mg/dL   HDL 40 (L) >40 mg/dL   Total CHOL/HDL Ratio 4.9 RATIO   VLDL 38 0 - 40 mg/dL   LDL Cholesterol 116 (H) 0 - 99 mg/dL    Comment:        Total Cholesterol/HDL:CHD Risk Coronary Heart Disease Risk Table                     Men   Women  1/2 Average Risk   3.4   3.3  Average Risk       5.0   4.4  2 X Average Risk   9.6   7.1  3 X Average Risk  23.4   11.0        Use the calculated Patient Ratio above and the CHD Risk Table to determine the patient's CHD Risk.        ATP III CLASSIFICATION (LDL):  <100     mg/dL   Optimal  100-129  mg/dL   Near or Above  Optimal  130-159  mg/dL   Borderline  160-189  mg/dL   High  >190     mg/dL   Very High   CBC     Status: Abnormal   Collection Time: 12/16/15  4:58 AM  Result Value Ref Range   WBC 6.1 4.0 - 10.5  K/uL   RBC 3.80 (L) 3.87 - 5.11 MIL/uL   Hemoglobin 12.0 12.0 - 15.0 g/dL   HCT 36.0 36.0 - 46.0 %   MCV 94.7 78.0 - 100.0 fL   MCH 31.6 26.0 - 34.0 pg   MCHC 33.3 30.0 - 36.0 g/dL   RDW 12.5 11.5 - 15.5 %   Platelets 233 150 - 400 K/uL  Basic metabolic panel     Status: Abnormal   Collection Time: 12/16/15  4:58 AM  Result Value Ref Range   Sodium 141 135 - 145 mmol/L   Potassium 3.6 3.5 - 5.1 mmol/L   Chloride 105 101 - 111 mmol/L   CO2 29 22 - 32 mmol/L   Glucose, Bld 118 (H) 65 - 99 mg/dL   BUN 15 6 - 20 mg/dL   Creatinine, Ser 0.62 0.44 - 1.00 mg/dL   Calcium 9.2 8.9 - 10.3 mg/dL   GFR calc non Af Amer >60 >60 mL/min   GFR calc Af Amer >60 >60 mL/min    Comment: (NOTE) The eGFR has been calculated using the CKD EPI equation. This calculation has not been validated in all clinical situations. eGFR's persistently <60 mL/min signify possible Chronic Kidney Disease.    Anion gap 7 5 - 15  Troponin I     Status: None   Collection Time: 12/16/15  4:58 AM  Result Value Ref Range   Troponin I <0.03 <0.03 ng/mL  Troponin I     Status: None   Collection Time: 12/16/15  2:05 PM  Result Value Ref Range   Troponin I <0.03 <0.03 ng/mL  Troponin I     Status: None   Collection Time: 12/16/15  7:26 PM  Result Value Ref Range   Troponin I <0.03 <0.03 ng/mL   Dg Chest 2 View  12/15/2015  CLINICAL DATA:  Acute chest pain and shortness of breath for 1 day. EXAM: CHEST  2 VIEW COMPARISON:  None. FINDINGS: The cardiopericardial silhouette is unremarkable. A mildly tortuous/ectatic thoracic aorta noted. There is no evidence of focal airspace disease, pulmonary edema, suspicious pulmonary nodule/mass, pleural effusion, or pneumothorax. No acute bony abnormalities are identified. IMPRESSION: No evidence of acute cardiopulmonary disease. Electronically Signed   By: Margarette Canada M.D.   On: 12/15/2015 20:02   Ct Head Wo Contrast  12/16/2015  CLINICAL DATA:  60 year old female with a history of  chest pain. Headache. EXAM: CT HEAD WITHOUT CONTRAST TECHNIQUE: Contiguous axial images were obtained from the base of the skull through the vertex without intravenous contrast. COMPARISON:  None. FINDINGS: Unremarkable appearance of the calvarium without acute fracture or aggressive lesion. Unremarkable appearance of the scalp soft tissues. Bilateral proptosis. Mastoid air cells are clear. Low-density lobulated soft tissue of the right maxillary sinus. No acute intracranial hemorrhage. No midline shift. No mass effect. Adjacent small cysts in the left caudate and anterior putamen. These measure approximately 12 mm and 11 mm. No mass effect. IMPRESSION: No CT evidence of acute intracranial abnormality. 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 Bilateral proptosis. Correlation with thyroid  lab values may be useful. Signed, Dulcy Fanny. Earleen Newport, DO [facility] Radiology Electronically Signed   By: Corrie Mckusick D.O.   On: 12/16/2015 08:13   Nm Myocar Multi W/spect W/wall Motion / Ef  12/16/2015  CLINICAL DATA:  Chest pain.  Hypertension and elevated cholesterol. EXAM: MYOCARDIAL IMAGING WITH SPECT (REST AND PHARMACOLOGIC-STRESS) GATED LEFT VENTRICULAR WALL MOTION STUDY LEFT VENTRICULAR EJECTION FRACTION TECHNIQUE: Standard myocardial SPECT imaging was performed after resting intravenous injection of 10 mCi Tc-45mtetrofosmin. Subsequently, intravenous infusion of Lexiscan was performed under the supervision of the Cardiology staff. At peak effect of the drug, 30 mCi Tc-982metrofosmin was injected intravenously and standard myocardial SPECT imaging was performed. Quantitative gated imaging was also performed to evaluate left ventricular wall motion, and estimate left ventricular ejection fraction. COMPARISON:  None. FINDINGS: Perfusion: No  decreased activity in the left ventricle on stress imaging to suggest reversible ischemia or infarction. Wall Motion: Small area of mild reversibility involves the distal septum and distal inferior wall Left Ventricular Ejection Fraction: 71 % End diastolic volume 80 ml End systolic volume 24 ml IMPRESSION: 1. Small area of mild reversibility involves the distal septum and distal aspect of the inferior wall. 2. Normal left ventricular wall motion. 3. Left ventricular ejection fraction 71% 4. Non invasive risk stratification*: Low *2012 Appropriate Use Criteria for Coronary Revascularization Focused Update: J Am Coll Cardiol. 205638;93(7):[email or web address] Electronically Signed   By: TaKerby Moors.D.   On: 12/16/2015 15:32    ROS: General:no colds or fevers, no weight changes Skin:no rashes or ulcers HEENT:no blurred vision, no congestion CV:see HPI PUL:see HPI GI:no diarrhea constipation or melena, no indigestion GU:no hematuria, no dysuria MS:no joint pain, no claudication Neuro:no syncope, no lightheadedness Endo:no diabetes, no thyroid disease  Blood pressure 119/57, pulse 60, temperature 98.3 F (36.8 C), temperature source Oral, resp. rate 11, height 5' 1"  (1.549 m), weight 179 lb 6.4 oz (81.375 kg), SpO2 99 %.  Wt Readings from Last 3 Encounters:  12/17/15 179 lb 6.4 oz (81.375 kg)    PE: General:Pleasant affect, NAD Skin:Warm and dry, brisk capillary refill HEENT:normocephalic, sclera clear, mucus membranes moist Neck:supple, no JVD, no bruits  Heart:S1S2 RRR without murmur, gallup, rub or click Chest wall + pain to palpation  Lungs:clear without rales, rhonchi, or wheezes AbOTL:XBWIO,MBTDnon tender, + BS, do not palpate liver spleen or masses Ext:no lower ext edema, 2+ pedal pulses, 2+ radial pulses Neuro:alert and oriented X 3, MAE, follows commands, + facial symmetry TELE:  SB to 49 at times  Assessment/Plan Active  Problems:   Chest pain- neg MI, mildly abnormal nuc study, symptoms seema combination of typical and Atypical - Dr. NeMeda Coffeeo see    HTN (hypertension)   Hyperlipidemia LDL goal <100   Abnormal cardiovascular stress test   Sinus Bradycardia to 49 at night ? Sleep apnea, but would not place on BB  LaCecilie KicksNurse Practitioner Certified [place]ager [phone] after 5pm or weekends call [phone] 12/17/2015, 9:26 AM   The patient was seen, examined and discussed with LaCecilie KicksNP and I agree with the above.   This is a very pleasant 89ear old female originally from [place] presented with midsternal and left sided chest pain radiating to her neck. She has a history of hypertension and hyperlipidemia as well as obesity. She doesn't have family history of coronary artery disease and she has never smoked. On physical exam patient's  pain is somewhat reproducible but she also states that she has noticed worsening shortness of breath when she is walking. Originally Dr. Wynonia Lawman was asked to evaluate her and he recommended stress test that came back abnormal with reversible defect in the mid inferoseptal and inferior walls.  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 At the same time this is a middle-age woman with multiple risk factors including diabetes who has abnormal EKG suspicious for lateral wall ischemia and has been noticing progressively worsening shortness of breath on exertion.  We will schedule left cardiac cath for tomorrow. Her blood pressure is well controlled, she is on pravastatin for hyperlipidemia, if she has finding of coronary artery disease this will need to be switched to either atorvastatin or  rosuvastatin.  Ena Dawley 12/17/2015

## 2015-12-18 ENCOUNTER — Encounter (HOSPITAL_COMMUNITY)
Admission: EM | Disposition: A | Discharge status: Home / Self Care | Payer: Self-pay | Source: Home / Self Care | Attending: Emergency Medicine

## 2015-12-18 ENCOUNTER — Encounter (HOSPITAL_COMMUNITY): Payer: Self-pay | Admitting: Interventional Cardiology

## 2015-12-18 DIAGNOSIS — R9439 Abnormal result of other cardiovascular function study: Secondary | ICD-10-CM | POA: Diagnosis not present

## 2015-12-18 DIAGNOSIS — E785 Hyperlipidemia, unspecified: Secondary | ICD-10-CM | POA: Diagnosis not present

## 2015-12-18 DIAGNOSIS — I200 Unstable angina: Secondary | ICD-10-CM | POA: Diagnosis not present

## 2015-12-18 DIAGNOSIS — I209 Angina pectoris, unspecified: Secondary | ICD-10-CM | POA: Diagnosis not present

## 2015-12-18 DIAGNOSIS — R079 Chest pain, unspecified: Secondary | ICD-10-CM | POA: Insufficient documentation

## 2015-12-18 HISTORY — PX: CARDIAC CATHETERIZATION: SHX172

## 2015-12-18 LAB — CBC
HCT: 39.4 % (ref 36.0–46.0)
HEMOGLOBIN: 12.8 g/dL (ref 12.0–15.0)
MCH: 30.6 pg (ref 26.0–34.0)
MCHC: 32.5 g/dL (ref 30.0–36.0)
MCV: 94.3 fL (ref 78.0–100.0)
Platelets: 242 10*3/uL (ref 150–400)
RBC: 4.18 MIL/uL (ref 3.87–5.11)
RDW: 12.2 % (ref 11.5–15.5)
WBC: 3.9 10*3/uL — ABNORMAL LOW (ref 4.0–10.5)

## 2015-12-18 LAB — CREATININE, SERUM
CREATININE: 0.67 mg/dL (ref 0.44–1.00)
GFR calc Af Amer: >60 mL/min (ref >60)
GFR calc non Af Amer: >60 mL/min (ref >60)

## 2015-12-18 LAB — PROTIME-INR
INR: 1.04 (ref 0.00–1.49)
Prothrombin Time: 13.8 seconds (ref 11.6–15.2)

## 2015-12-18 SURGERY — LEFT HEART CATH AND CORONARY ANGIOGRAPHY

## 2015-12-18 MED ORDER — SODIUM CHLORIDE 0.9% FLUSH: 3.0000 mL | Freq: Two times a day (BID) | INTRAVENOUS | Status: DC

## 2015-12-18 MED ORDER — FENTANYL CITRATE (PF) 100 MCG/2ML IJ SOLN
INTRAMUSCULAR | Status: DC | PRN
  Administered 2015-12-18 (×2): 25 ug via INTRAVENOUS

## 2015-12-18 MED ORDER — ACETAMINOPHEN 325 MG PO TABS: 650.0000 mg | ORAL_TABLET | ORAL | Status: DC | PRN

## 2015-12-18 MED ORDER — ASPIRIN EC 81 MG PO TBEC: 81.0000 mg | DELAYED_RELEASE_TABLET | Freq: Every day | ORAL | Status: DC

## 2015-12-18 MED ORDER — SODIUM CHLORIDE 0.9 % WEIGHT BASED INFUSION: 1.0000 mL/kg/h | INTRAVENOUS | Status: DC

## 2015-12-18 MED ORDER — MIDAZOLAM HCL 2 MG/2ML IJ SOLN
INTRAMUSCULAR | Status: AC
  Filled 2015-12-18: qty 2

## 2015-12-18 MED ORDER — SODIUM CHLORIDE 0.9% FLUSH: 3.0000 mL | INTRAVENOUS | Status: DC | PRN

## 2015-12-18 MED ORDER — ACETAMINOPHEN 325 MG PO TABS
650.0000 mg | ORAL_TABLET | ORAL | Status: AC | PRN
Start: 2015-12-18 — End: ?

## 2015-12-18 MED ORDER — HEPARIN SODIUM (PORCINE) 1000 UNIT/ML IJ SOLN
INTRAMUSCULAR | Status: AC
  Filled 2015-12-18: qty 1

## 2015-12-18 MED ORDER — SODIUM CHLORIDE 0.9 % WEIGHT BASED INFUSION: 1.0000 mL/kg/h | INTRAVENOUS | Status: AC

## 2015-12-18 MED ORDER — ONDANSETRON HCL 4 MG/2ML IJ SOLN: 4.0000 mg | Freq: Four times a day (QID) | INTRAMUSCULAR | Status: DC | PRN

## 2015-12-18 MED ORDER — HEPARIN (PORCINE) IN NACL 2-0.9 UNIT/ML-% IJ SOLN
INTRAMUSCULAR | Status: DC | PRN
  Administered 2015-12-18: 12:00:00

## 2015-12-18 MED ORDER — IOPAMIDOL (ISOVUE-370) INJECTION 76%
INTRAVENOUS | Status: DC | PRN
  Administered 2015-12-18: 60 mL via INTRA_ARTERIAL

## 2015-12-18 MED ORDER — IOPAMIDOL (ISOVUE-370) INJECTION 76%
INTRAVENOUS | Status: AC
  Filled 2015-12-18: qty 100

## 2015-12-18 MED ORDER — HEPARIN SODIUM (PORCINE) 5000 UNIT/ML IJ SOLN: 5000.0000 [IU] | Freq: Three times a day (TID) | INTRAMUSCULAR | Status: DC

## 2015-12-18 MED ORDER — FENTANYL CITRATE (PF) 100 MCG/2ML IJ SOLN
INTRAMUSCULAR | Status: AC
  Filled 2015-12-18: qty 2

## 2015-12-18 MED ORDER — LIDOCAINE HCL (PF) 1 % IJ SOLN
INTRAMUSCULAR | Status: AC
  Filled 2015-12-18: qty 30

## 2015-12-18 MED ORDER — ASPIRIN 81 MG PO CHEW: 81.0000 mg | CHEWABLE_TABLET | ORAL | Status: AC

## 2015-12-18 MED ORDER — SODIUM CHLORIDE 0.9 % IV SOLN: 250.0000 mL | INTRAVENOUS | Status: DC | PRN

## 2015-12-18 MED ORDER — SODIUM CHLORIDE 0.9 % WEIGHT BASED INFUSION: 3.0000 mL/kg/h | INTRAVENOUS | Status: AC

## 2015-12-18 MED ORDER — MIDAZOLAM HCL 2 MG/2ML IJ SOLN
INTRAMUSCULAR | Status: DC | PRN
  Administered 2015-12-18: 2 mg via INTRAVENOUS
  Administered 2015-12-18: 1 mg via INTRAVENOUS

## 2015-12-18 MED ORDER — VERAPAMIL HCL 2.5 MG/ML IV SOLN
INTRAVENOUS | Status: AC
  Filled 2015-12-18: qty 2

## 2015-12-18 MED ORDER — UNABLE TO FIND: Status: AC

## 2015-12-18 MED ORDER — HEPARIN (PORCINE) IN NACL 2-0.9 UNIT/ML-% IJ SOLN
INTRAMUSCULAR | Status: AC
  Filled 2015-12-18: qty 1000

## 2015-12-18 MED ORDER — SODIUM CHLORIDE 0.9% FLUSH
3.0000 mL | INTRAVENOUS | Status: DC | PRN
Start: 2015-12-18 — End: 2015-12-18

## 2015-12-18 MED ORDER — IBUPROFEN 400 MG PO TABS: 400.0000 mg | ORAL_TABLET | Freq: Four times a day (QID) | ORAL | Status: AC | PRN

## 2015-12-18 MED ORDER — LIDOCAINE HCL (PF) 1 % IJ SOLN
INTRAMUSCULAR | Status: DC | PRN
Start: 2015-12-18 — End: 2015-12-18
  Administered 2015-12-18: 2 mL
  Administered 2015-12-18: 20 mL

## 2015-12-18 SURGICAL SUPPLY — 13 items
CATH INFINITI 5 FR JL3.5 (CATHETERS) ×3 IMPLANT
CATH INFINITI 5FR ANG PIGTAIL (CATHETERS) ×3 IMPLANT
CATH INFINITI 5FR JL4 (CATHETERS) ×3 IMPLANT
CATH INFINITI JR4 5F (CATHETERS) ×3 IMPLANT
GLIDESHEATH SLEND SS 6F .021 (SHEATH) ×3 IMPLANT
KIT HEART LEFT (KITS) ×3 IMPLANT
PACK CARDIAC CATHETERIZATION (CUSTOM PROCEDURE TRAY) ×3 IMPLANT
SHEATH PINNACLE 5F 10CM (SHEATH) ×3 IMPLANT
SYR MEDRAD MARK V 150ML (SYRINGE) ×3 IMPLANT
TRANSDUCER W/STOPCOCK (MISCELLANEOUS) ×3 IMPLANT
TUBING CIL FLEX 10 FLL-RA (TUBING) ×3 IMPLANT
WIRE EMERALD 3MM-J .035X150CM (WIRE) ×3 IMPLANT
WIRE SAFE-T 1.5MM-J .035X260CM (WIRE) ×3 IMPLANT

## 2015-12-18 NOTE — Progress Notes (Addendum)
Site area: Right groin a 5 french arterial sheath was removed  Site Prior to Removal:  Level 0  Pressure Applied For 15 MINUTES    Bedrest Beginning at 1255p Manual:   Yes.    Patient Status During Pull:  stable  Post Pull Groin Site:  Level 0  Post Pull Instructions Given:  Yes.    Post Pull Pulses Present:  Yes.    Dressing Applied:  Yes.    Comments:  VS remain stable during sheath pull.

## 2015-12-18 NOTE — Progress Notes (Signed)
Patient's HR dipped down to 39 twice. Currently Sinus brady. Patient asymptomatic and denies any chest pain, dizziness, or SHOB. During the day, the patient is NSR. Paged on call triad to inform them. Patient is resting. Will continue to monitor.

## 2015-12-18 NOTE — Progress Notes (Signed)
Interpreter Wyvonnia DuskyGraciela Namihira for [place] MD

## 2015-12-18 NOTE — Interval H&P Note (Signed)
Cath Lab Visit (complete for each Cath Lab visit)  Clinical Evaluation Leading to the Procedure:   ACS: Yes.    Non-ACS:    Anginal Classification: CCS IV  Anti-ischemic medical therapy: Minimal Therapy (1 class of medications)  Non-Invasive Test Results: No non-invasive testing performed  Prior CABG: No previous CABG   Chest pain at rest upon arrival to the cath lab.   History and Physical Interval Note:  12/18/2015 11:18 AM  Monica Carr  has presented today for surgery, with the diagnosis of cp  The various methods of treatment have been discussed with the patient and family. After consideration of risks, benefits and other options for treatment, the patient has consented to  Procedure(s): Left Heart Cath and Coronary Angiography (N/A) as a surgical intervention .  The patient's history has been reviewed, patient examined, no change in status, stable for surgery.  I have reviewed the patient's chart and labs.  Questions were answered to the patient's satisfaction.     Lance MussJayadeep Chaselynn Kepple

## 2015-12-18 NOTE — H&P (View-Only) (Signed)
    SUBJECTIVE:  She is still having some left sided mild chest pain.     PHYSICAL EXAM Filed Vitals:   12/18/15 0230 12/18/15 0300 12/18/15 0330 12/18/15 0457  BP:    112/54  Pulse: 50 49 48 52  Temp:    98.1 F (36.7 C)  TempSrc:    Oral  Resp: 10 16 10 14  Height:      Weight:    178 lb 9.6 oz (81.012 kg)  SpO2: 95% 94% 98% 97%   General:  No distress Lungs:  Clear Heart:  RRR Abdomen:  Positive bowel sounds, no rebound no guarding Extremities:  No edema  LABS: Lab Results  Component Value Date   TROPONINI <0.03 12/16/2015   No results found for this or any previous visit (from the past 24 hour(s)).  Intake/Output Summary (Last 24 hours) at 12/18/15 0916 Last data filed at 12/17/15 2020  Gross per 24 hour  Intake    483 ml  Output      0 ml  Net    483 ml     ASSESSMENT AND PLAN:  CHEST PAIN:  Cath today.  Abnormal stress test.     Enzymes are negative.   HTN:  BP is well controlled.  No change in therapy.   Monica Carr 12/18/2015 9:16 AM   

## 2015-12-18 NOTE — Progress Notes (Signed)
    SUBJECTIVE:  She is still having some left sided mild chest pain.     PHYSICAL EXAM Filed Vitals:   12/18/15 0230 12/18/15 0300 12/18/15 0330 12/18/15 0457  BP:    112/54  Pulse: 50 49 48 52  Temp:    98.1 F (36.7 C)  TempSrc:    Oral  Resp: 10 16 10 14   Height:      Weight:    178 lb 9.6 oz (81.012 kg)  SpO2: 95% 94% 98% 97%   General:  No distress Lungs:  Clear Heart:  RRR Abdomen:  Positive bowel sounds, no rebound no guarding Extremities:  No edema  LABS: Lab Results  Component Value Date   TROPONINI <0.03 12/16/2015   No results found for this or any previous visit (from the past 24 hour(s)).  Intake/Output Summary (Last 24 hours) at 12/18/15 0916 Last data filed at 12/17/15 2020  Gross per 24 hour  Intake    483 ml  Output      0 ml  Net    483 ml     ASSESSMENT AND PLAN:  CHEST PAIN:  Cath today.  Abnormal stress test.     Enzymes are negative.   HTN:  BP is well controlled.  No change in therapy.   Fayrene FearingJames [facility] Cent[facility]chrein 12/18/2015 9:16 AM

## 2015-12-19 MED FILL — Heparin Sodium (Porcine) Inj 1000 Unit/ML: INTRAMUSCULAR | Qty: 10 | Status: AC

## 2015-12-19 MED FILL — Verapamil HCl IV Soln 2.5 MG/ML: INTRAVENOUS | Qty: 2 | Status: AC

## 2015-12-19 NOTE — Discharge Summary (Signed)
Physician Discharge Summary  Monica Carr AOZ:[id number] DOB: 09/11/1955 DOA: 12/15/2015  PCP: No PCP Per Patient  Admit date: 12/15/2015 Discharge date: 12/18/2015  Time spent: 45 minutes  Recommendations for Outpatient Follow-up:  1. PCP in 1 week   Discharge Diagnoses:  Active Problems:   Chest pain   HTN (hypertension)   Hyperlipidemia LDL goal <100   Abnormal cardiovascular stress test   Unstable angina (HCC)   Pain in the chest   Discharge Condition: stable  Diet recommendation: heart healthy  Filed Weights   12/16/15 0334 12/17/15 0455 12/18/15 0457  Weight: 81.058 kg (178 lb 11.2 oz) 81.375 kg (179 lb 6.4 oz) 81.012 kg (178 lb 9.6 oz)    History of present illness:  60 year old female with h/o HTN and Dyslipidemia was admitted with chest pain since 2 days. Chest pain is left-sided. She denies radiation down the arm with since he related to her face, described this chest pain as sharp and continuous. At its worse it was 10/10, currently 7/10  Hospital Course:  1. Atypical chest pain -admitted as observation and ruled out for ACS, troponins and D dimer were negative -Lexiscan myoview completed this was mildly abnormal and hence Cardiology was conducted and she underwent a Left heart cath which showed normal EF and no significant CAD -discharged home in a stable condition  2. Hypertension -stable, continue lisinopril/HCTZ  3. Dyslipidemia -continue statin  Procedures:  Myoview IMPRESSION: 1. Small area of mild reversibility involves the distal septum and distal aspect of the inferior wall. 2. Normal left ventricular wall motion. 3. Left ventricular ejection fraction 71% 4. Non invasive risk stratification*: Low   Left heart cath 7/17:  -The left ventricular systolic function is normal. No significant CAD.  Consultations: Cardiology Discharge Exam: Filed Vitals:   12/18/15 1535 12/18/15 1605  BP: 121/66 134/67  Pulse: 57   Temp:    Resp:  18 15    General: AAOx3 Cardiovascular: S1S2/RRR Respiratory: CTAb  Discharge Instructions   Discharge Instructions    Diet - low sodium heart healthy    Complete by:  As directed      Diet - low sodium heart healthy    Complete by:  As directed      Increase activity slowly    Complete by:  As directed      Increase activity slowly    Complete by:  As directed           Discharge Medication List as of 12/18/2015  5:09 PM    START taking these medications   Details  acetaminophen (TYLENOL) 325 MG tablet Take 2 tablets (650 mg total) by mouth every 4 (four) hours as needed for headache or mild pain., Starting 12/18/2015, Until Discontinued, OTC    ibuprofen (ADVIL,MOTRIN) 400 MG tablet Take 1 tablet (400 mg total) by mouth every 6 (six) hours as needed for moderate pain. For 2-3days on full stomach as needed for Aches and pains , DO no take this long term, Starting 12/18/2015, Until Discontinued, OTC    UNABLE TO FIND This note is to excuse Ms.Carr from work due to medical illness requiring hospitalization 7/15-7/17, ok to return to work on 7/20, Print      CONTINUE these medications which have NOT CHANGED   Details  aspirin 81 MG tablet Take 81 mg by mouth daily., Until Discontinued, Historical Med    lisinopril-hydrochlorothiazide (PRINZIDE,ZESTORETIC) 20-25 MG tablet Take 1 tablet by mouth daily., Until Discontinued, Historical Med    pravastatin (  PRAVACHOL) 40 MG tablet Take 40 mg by mouth daily., Until Discontinued, Historical Med      STOP taking these medications     Aspirin (ASPIR-81 PO)        No Known Allergies Follow-up Information    Follow up with PCP . Schedule an appointment as soon as possible for a visit in 1 week.       The results of significant diagnostics from this hospitalization (including imaging, microbiology, ancillary and laboratory) are listed below for reference.    Significant Diagnostic Studies: Dg Chest 2 View  12/15/2015   CLINICAL DATA:  Acute chest pain and shortness of breath for 1 day. EXAM: CHEST  2 VIEW COMPARISON:  None. FINDINGS: The cardiopericardial silhouette is unremarkable. A mildly tortuous/ectatic thoracic aorta noted. There is no evidence of focal airspace disease, pulmonary edema, suspicious pulmonary nodule/mass, pleural effusion, or pneumothorax. No acute bony abnormalities are identified. IMPRESSION: No evidence of acute cardiopulmonary disease. Electronically Signed   By: Harmon Pier M.D.   On: 12/15/2015 20:02   Ct Head Wo Contrast  12/16/2015  CLINICAL DATA:  60 year old female with a history of chest pain. Headache. EXAM: CT HEAD WITHOUT CONTRAST TECHNIQUE: Contiguous axial images were obtained from the base of the skull through the vertex without intravenous contrast. COMPARISON:  None. FINDINGS: Unremarkable appearance of the calvarium without acute fracture or aggressive lesion. Unremarkable appearance of the scalp soft tissues. Bilateral proptosis. Mastoid air cells are clear. Low-density lobulated soft tissue of the right maxillary sinus. No acute intracranial hemorrhage. No midline shift. No mass effect. Adjacent small cysts in the left caudate and anterior putamen. These measure approximately 12 mm and 11 mm. No mass effect. IMPRESSION: No CT evidence of acute intracranial abnormality. 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 Bilateral proptosis. Correlation with thyroid lab values may be useful. Signed, Yvone Neu. Loreta Ave, DO [facility] Electronically Signed   By: Gilmer Mor D.O.   On: 12/16/2015 08:13   Nm Myocar Multi W/spect W/wall Motion / Ef  12/16/2015  CLINICAL DATA:  Chest pain.  Hypertension and elevated cholesterol. EXAM: MYOCARDIAL IMAGING WITH SPECT (REST AND PHARMACOLOGIC-STRESS) GATED LEFT  VENTRICULAR WALL MOTION STUDY LEFT VENTRICULAR EJECTION FRACTION TECHNIQUE: Standard myocardial SPECT imaging was performed after resting intravenous injection of 10 mCi Tc-107m tetrofosmin. Subsequently, intravenous infusion of Lexiscan was performed under the supervision of the Cardiology staff. At peak effect of the drug, 30 mCi Tc-75m tetrofosmin was injected intravenously and standard myocardial SPECT imaging was performed. Quantitative gated imaging was also performed to evaluate left ventricular wall motion, and estimate left ventricular ejection fraction. COMPARISON:  None. FINDINGS: Perfusion: No decreased activity in the left ventricle on stress imaging to suggest reversible ischemia or infarction. Wall Motion: Small area of mild reversibility involves the distal septum and distal inferior wall Left Ventricular Ejection Fraction: 71 % End diastolic volume 80 ml End systolic volume 24 ml IMPRESSION: 1. Small area of mild reversibility involves the distal septum and distal aspect of the inferior wall. 2. Normal left ventricular wall motion. 3. Left ventricular ejection fraction 71% 4. Non invasive risk stratification*: Low *2012 Appropriate Use Criteria for Coronary Revascularization Focused Update: J Am Coll Cardiol. 2012;59(9):857-881. http://[email or web address]?[email or web address] Electronically Signed   By: Signa Kell M.D.   On: 12/16/2015 15:32    Microbiology: No results found for this or any  previous visit (from the past 240 hour(s)).   Labs: Basic Metabolic Panel:  Recent Labs Lab 12/15/15 1930 12/16/15 0458 12/18/15 1350  NA 139 141  --   K 3.5 3.6  --   CL 105 105  --   CO2 27 29  --   GLUCOSE 99 118*  --   BUN 22* 15  --   CREATININE 0.62 0.62 0.67  CALCIUM 9.2 9.2  --    Liver Function Tests: No results for input(s): AST, ALT, ALKPHOS, BILITOT, PROT, ALBUMIN in the last 168 hours. No results for input(s): LIPASE, AMYLASE in the last 168 hours. No results  for input(s): AMMONIA in the last 168 hours. CBC:  Recent Labs Lab 12/15/15 1930 12/16/15 0458 12/18/15 1350  WBC 6.5 6.1 3.9*  HGB 12.0 12.0 12.8  HCT 35.5* 36.0 39.4  MCV 94.9 94.7 94.3  PLT 241 233 242   Cardiac Enzymes:  Recent Labs Lab 12/15/15 1930 12/15/15 2240 12/16/15 0458 12/16/15 1405 12/16/15 1926  TROPONINI <0.03 <0.03 <0.03 <0.03 <0.03   BNP: BNP (last 3 results) No results for input(s): BNP in the last 8760 hours.  ProBNP (last 3 results) No results for input(s): PROBNP in the last 8760 hours.  CBG: No results for input(s): GLUCAP in the last 168 hours.     SignedZannie Cove MD.  [facility] 12/19/2015, 5:11 PM

## 2017-09-30 IMAGING — CT CT HEAD W/O CM
3 of 4 series · 17 of 47 positions shown, 20 images · non-contrast
Comparison: None.

CLINICAL DATA: 60-year-old female with a history of chest pain.
Headache.

EXAM:
CT HEAD WITHOUT CONTRAST
TECHNIQUE: Contiguous axial images were obtained from the base of the skull
through the vertex without intravenous contrast.

[Series 201: head w/o, idose (1) · axial · non-contrast · 0.49mm/px · z∈[+181,+301]mm · 11 of 30 slices shown, 14 images]
[im 3/30  brain]
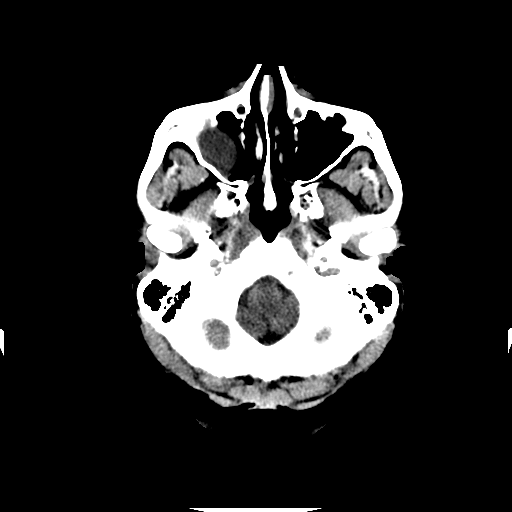
[im 3/30  bone]
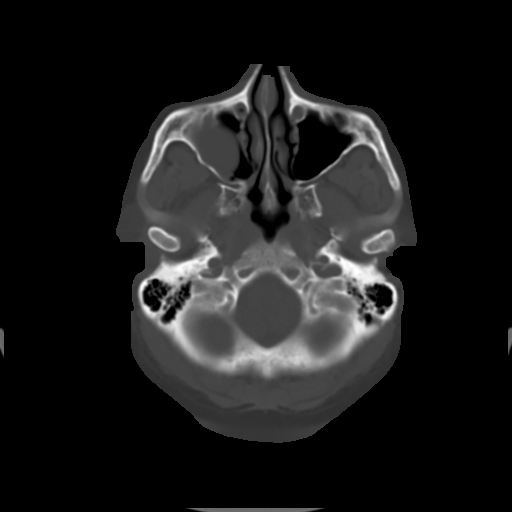
[im 5/30  brain]
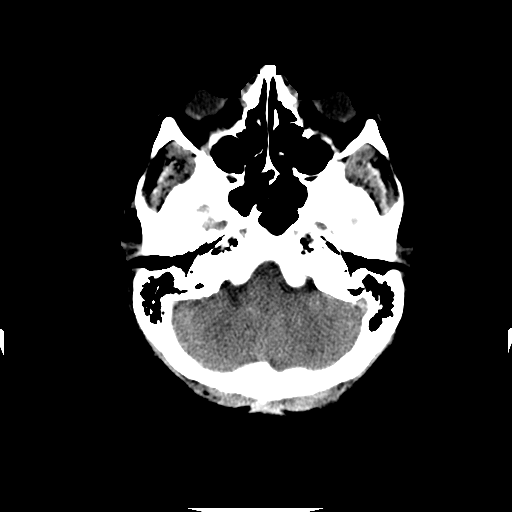
[im 7/30  brain]
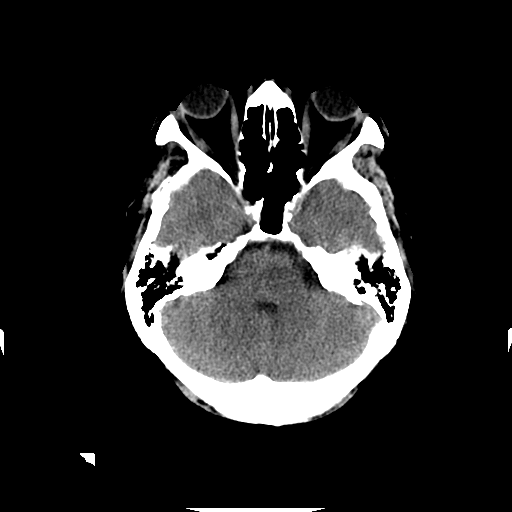
[im 11/30  brain]
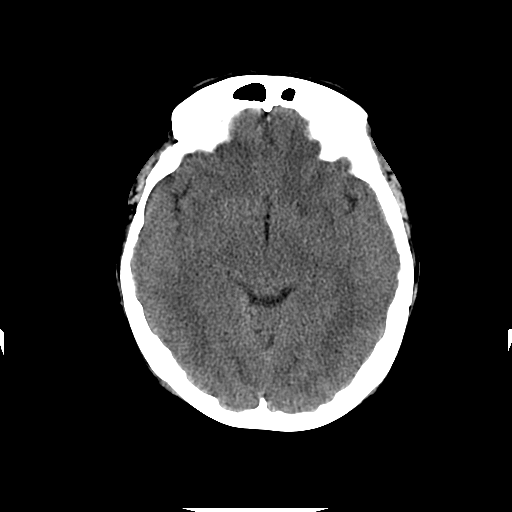
[im 13/30  brain]
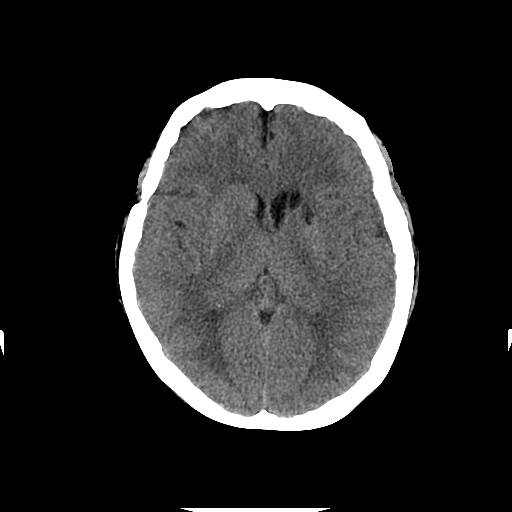
[im 13/30  bone]
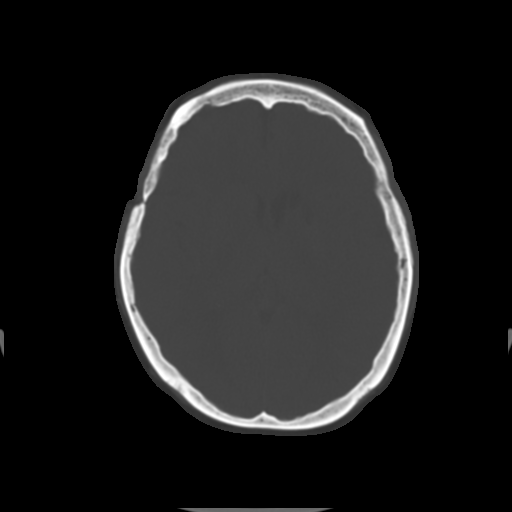
[im 15/30  brain]
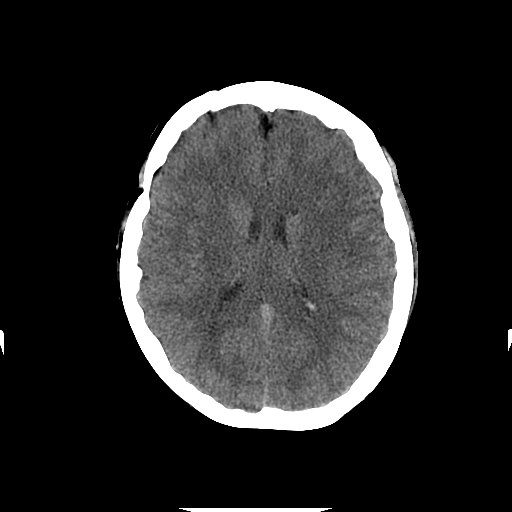
[im 17/30  brain]
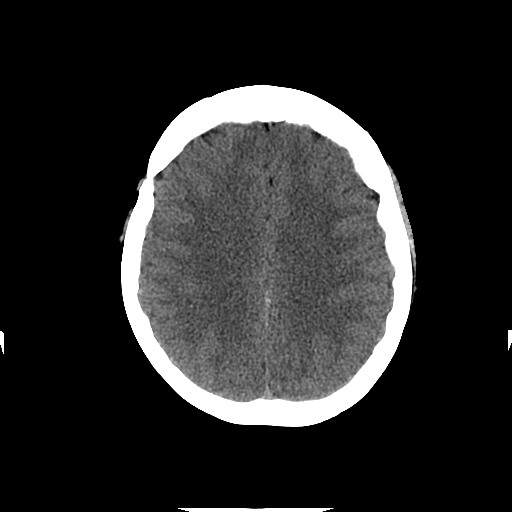
[im 19/30  brain]
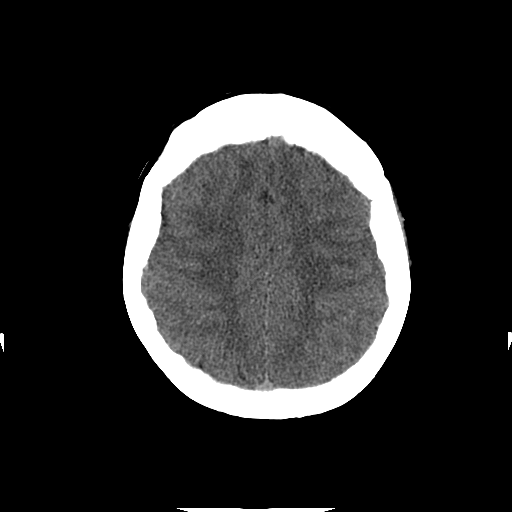
[im 23/30  brain]
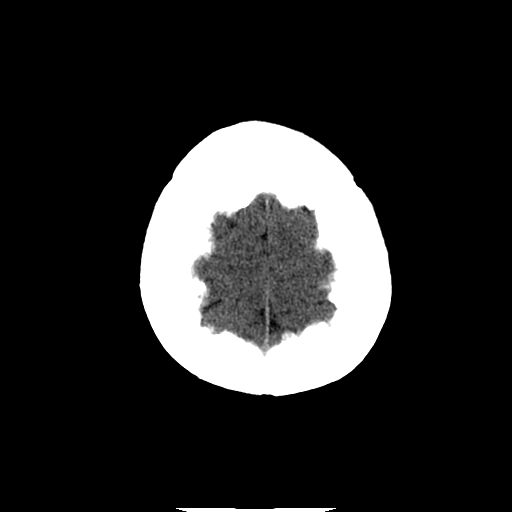
[im 23/30  bone]
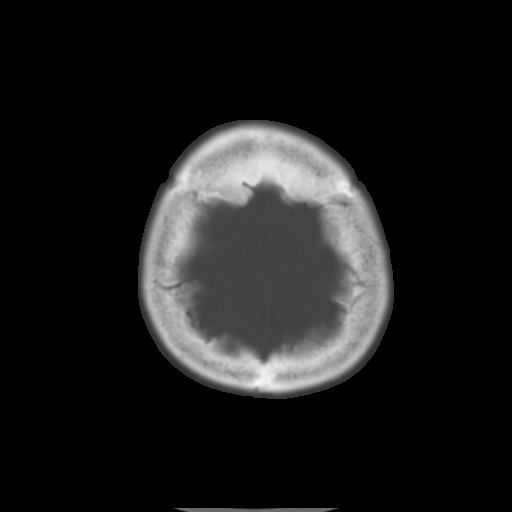
[im 25/30  brain]
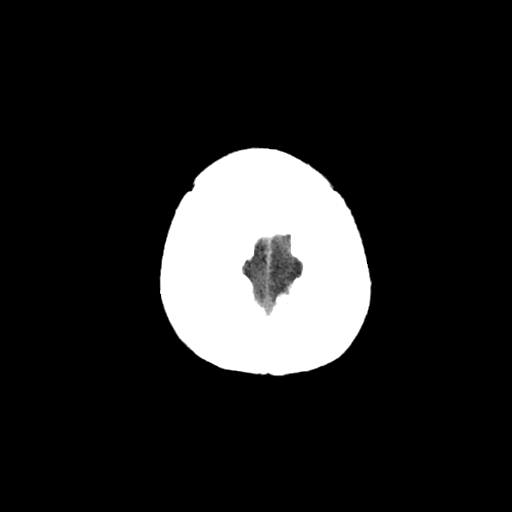
[im 27/30  brain]
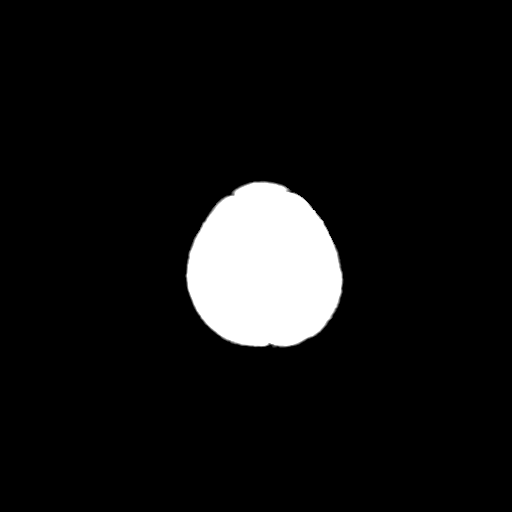

[Series 203: coronal st, idose (1) · coronal · 0.40mm/px · 3 of 78 slices shown]
[im 26/78  brain]
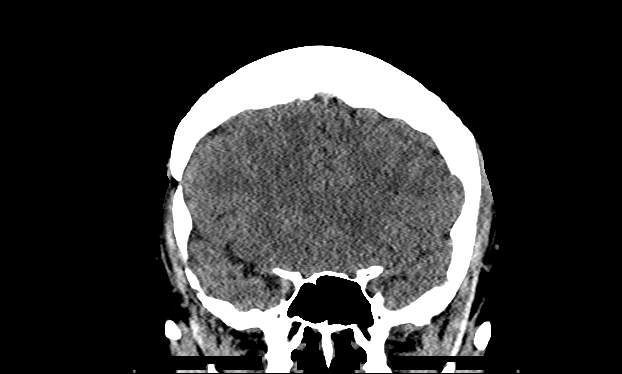
[im 35/78  brain]
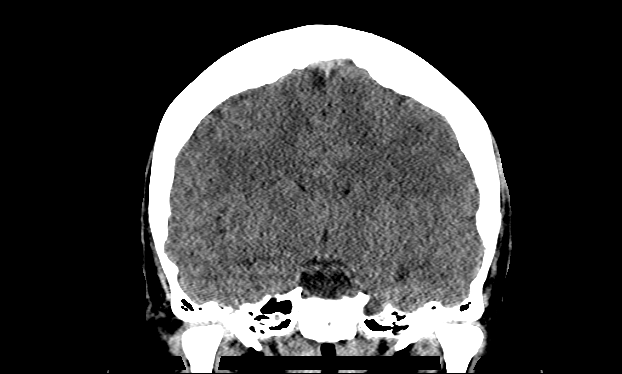
[im 43/78  brain]
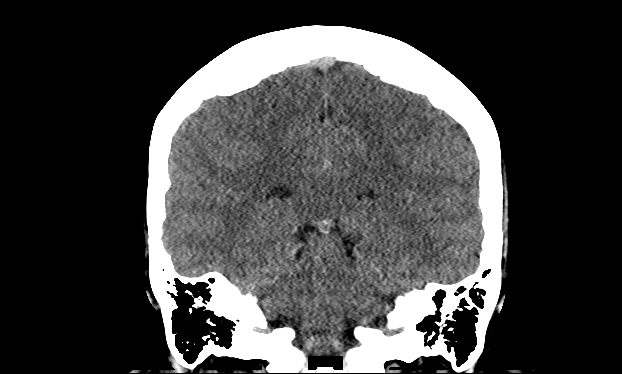

[Series 204: sagittal st, idose (1) · sagittal · 0.40mm/px · 3 of 83 slices shown]
[im 28/83  brain]
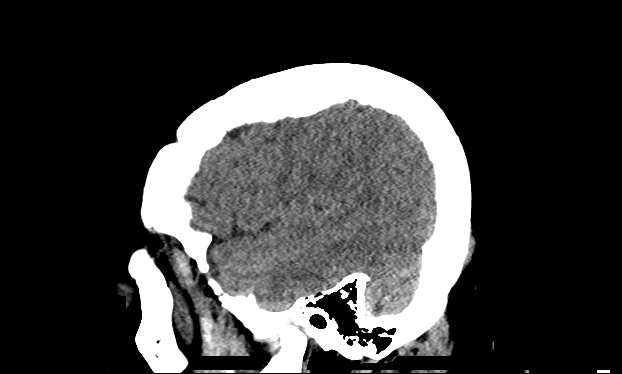
[im 42/83  brain]
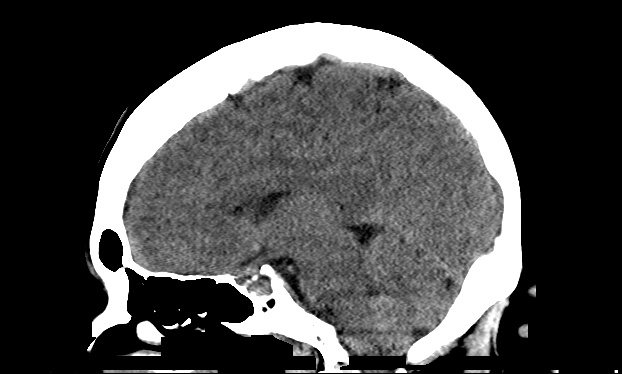
[im 55/83  brain]
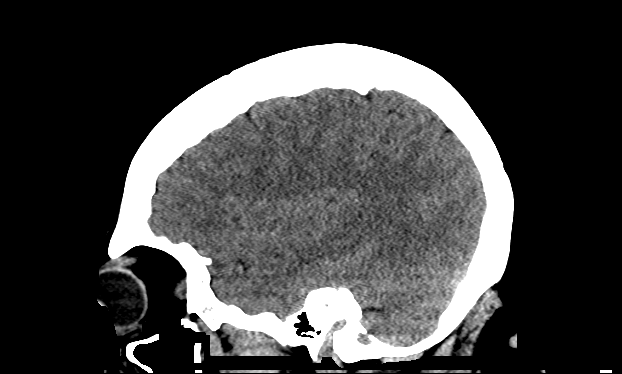

[17 of 47 positions shown; findings below may reference images not displayed]

FINDINGS: Unremarkable appearance of the calvarium without acute fracture or
aggressive lesion.

Unremarkable appearance of the scalp soft tissues.

Bilateral proptosis.

Mastoid air cells are clear.

Low-density lobulated soft tissue of the right maxillary sinus.

No acute intracranial hemorrhage. No midline shift. No mass effect.

Adjacent small cysts in the left caudate and anterior putamen. These
measure approximately 12 mm and 11 mm. No mass effect.
IMPRESSION: No CT evidence of acute intracranial abnormality.

Adjacent cystic changes of the left anterior basal ganglia, with no
mass effect, most likely remote lacunar infarction or other benign
entity such as perivascular space.

Bilateral proptosis. Correlation with thyroid lab values may be
useful.
